# Patient Record
Sex: Female | Born: 1965 | Race: White | Hispanic: No | Marital: Married | Smoking: Never smoker
Health system: Southern US, Community
[De-identification: ages and names within clinical notes are randomized; demographics above are authoritative.]

## PROBLEM LIST (undated history)

## (undated) DIAGNOSIS — T7840XA Allergy, unspecified, initial encounter: Secondary | ICD-10-CM

## (undated) DIAGNOSIS — L409 Psoriasis, unspecified: Secondary | ICD-10-CM

## (undated) HISTORY — PX: TONSILLECTOMY: SHX5217

## (undated) HISTORY — PX: WISDOM TOOTH EXTRACTION: SHX21

## (undated) HISTORY — PX: SINUSOTOMY: SHX291

## (undated) HISTORY — DX: Allergy, unspecified, initial encounter: T78.40XA

## (undated) HISTORY — PX: SHOULDER SURGERY: SHX246

## (undated) HISTORY — DX: Psoriasis, unspecified: L40.9

---

## 1998-08-26 ENCOUNTER — Ambulatory Visit (HOSPITAL_COMMUNITY): Admission: RE | Admit: 1998-08-26 | Discharge: 1998-08-26 | Payer: Self-pay | Admitting: Dermatology

## 1998-08-26 ENCOUNTER — Encounter: Payer: Self-pay | Admitting: Dermatology

## 1999-01-10 ENCOUNTER — Other Ambulatory Visit: Admission: RE | Admit: 1999-01-10 | Discharge: 1999-01-10 | Payer: Self-pay | Admitting: Gynecology

## 2000-03-13 ENCOUNTER — Other Ambulatory Visit: Admission: RE | Admit: 2000-03-13 | Discharge: 2000-03-13 | Payer: Self-pay | Admitting: Obstetrics & Gynecology

## 2001-03-13 ENCOUNTER — Other Ambulatory Visit: Admission: RE | Admit: 2001-03-13 | Discharge: 2001-03-13 | Payer: Self-pay | Admitting: Obstetrics & Gynecology

## 2002-04-10 ENCOUNTER — Other Ambulatory Visit: Admission: RE | Admit: 2002-04-10 | Discharge: 2002-04-10 | Payer: Self-pay | Admitting: Obstetrics & Gynecology

## 2002-06-12 ENCOUNTER — Encounter (INDEPENDENT_AMBULATORY_CARE_PROVIDER_SITE_OTHER): Payer: Self-pay

## 2002-06-12 ENCOUNTER — Ambulatory Visit (HOSPITAL_COMMUNITY): Admission: RE | Admit: 2002-06-12 | Discharge: 2002-06-12 | Payer: Self-pay | Admitting: Dermatology

## 2002-06-12 ENCOUNTER — Encounter: Payer: Self-pay | Admitting: Dermatology

## 2003-04-14 ENCOUNTER — Other Ambulatory Visit: Admission: RE | Admit: 2003-04-14 | Discharge: 2003-04-14 | Payer: Self-pay | Admitting: Obstetrics & Gynecology

## 2004-01-28 ENCOUNTER — Ambulatory Visit (HOSPITAL_COMMUNITY): Admission: RE | Admit: 2004-01-28 | Discharge: 2004-01-28 | Payer: Self-pay | Admitting: Emergency Medicine

## 2005-03-02 ENCOUNTER — Encounter: Admission: RE | Admit: 2005-03-02 | Discharge: 2005-05-31 | Payer: Self-pay | Admitting: Psychology

## 2005-03-02 ENCOUNTER — Ambulatory Visit: Payer: Self-pay | Admitting: Psychology

## 2005-03-08 ENCOUNTER — Encounter: Admission: RE | Admit: 2005-03-08 | Discharge: 2005-03-08 | Payer: Self-pay | Admitting: Emergency Medicine

## 2005-03-08 ENCOUNTER — Ambulatory Visit (HOSPITAL_COMMUNITY): Admission: AD | Admit: 2005-03-08 | Discharge: 2005-03-09 | Payer: Self-pay | Admitting: Emergency Medicine

## 2005-03-09 ENCOUNTER — Encounter (INDEPENDENT_AMBULATORY_CARE_PROVIDER_SITE_OTHER): Payer: Self-pay | Admitting: Specialist

## 2005-03-30 ENCOUNTER — Ambulatory Visit: Payer: Self-pay | Admitting: Psychology

## 2005-04-13 ENCOUNTER — Encounter: Admission: RE | Admit: 2005-04-13 | Discharge: 2005-04-13 | Payer: Self-pay | Admitting: Gastroenterology

## 2005-05-11 ENCOUNTER — Other Ambulatory Visit: Admission: RE | Admit: 2005-05-11 | Discharge: 2005-05-11 | Payer: Self-pay | Admitting: Obstetrics and Gynecology

## 2005-09-07 ENCOUNTER — Encounter (INDEPENDENT_AMBULATORY_CARE_PROVIDER_SITE_OTHER): Payer: Self-pay | Admitting: Specialist

## 2005-09-07 ENCOUNTER — Ambulatory Visit (HOSPITAL_COMMUNITY): Admission: RE | Admit: 2005-09-07 | Discharge: 2005-09-07 | Payer: Self-pay | Admitting: Dermatology

## 2005-09-14 ENCOUNTER — Ambulatory Visit: Payer: Self-pay | Admitting: Internal Medicine

## 2005-09-19 ENCOUNTER — Ambulatory Visit: Payer: Self-pay | Admitting: Internal Medicine

## 2006-05-31 ENCOUNTER — Other Ambulatory Visit: Admission: RE | Admit: 2006-05-31 | Discharge: 2006-05-31 | Payer: Self-pay | Admitting: Obstetrics and Gynecology

## 2007-12-11 DIAGNOSIS — K5090 Crohn's disease, unspecified, without complications: Secondary | ICD-10-CM | POA: Insufficient documentation

## 2007-12-11 DIAGNOSIS — J029 Acute pharyngitis, unspecified: Secondary | ICD-10-CM | POA: Insufficient documentation

## 2007-12-11 DIAGNOSIS — J309 Allergic rhinitis, unspecified: Secondary | ICD-10-CM | POA: Insufficient documentation

## 2007-12-12 ENCOUNTER — Ambulatory Visit: Payer: Self-pay | Admitting: Internal Medicine

## 2007-12-12 DIAGNOSIS — L272 Dermatitis due to ingested food: Secondary | ICD-10-CM | POA: Insufficient documentation

## 2007-12-12 LAB — CONVERTED CEMR LAB
Basophils Absolute: 0.1 10*3/uL (ref 0.0–0.1)
Eosinophils Absolute: 0.3 10*3/uL (ref 0.0–0.6)
Eosinophils Relative: 3.4 % (ref 0.0–5.0)
MCHC: 33.7 g/dL (ref 30.0–36.0)
Monocytes Relative: 6.7 % (ref 3.0–11.0)
Neutrophils Relative %: 72.1 % (ref 43.0–77.0)
Platelets: 201 10*3/uL (ref 150–400)
RDW: 12.8 % (ref 11.5–14.6)

## 2007-12-13 DIAGNOSIS — L408 Other psoriasis: Secondary | ICD-10-CM | POA: Insufficient documentation

## 2008-01-01 ENCOUNTER — Telehealth (INDEPENDENT_AMBULATORY_CARE_PROVIDER_SITE_OTHER): Payer: Self-pay | Admitting: *Deleted

## 2008-01-02 ENCOUNTER — Ambulatory Visit: Payer: Self-pay | Admitting: Internal Medicine

## 2008-01-14 ENCOUNTER — Ambulatory Visit: Payer: Self-pay | Admitting: Internal Medicine

## 2008-01-14 LAB — CONVERTED CEMR LAB
ALT: 18 units/L (ref 0–35)
Albumin: 3.8 g/dL (ref 3.5–5.2)
Alkaline Phosphatase: 49 units/L (ref 39–117)
Basophils Relative: 0.8 % (ref 0.0–1.0)
Hemoglobin: 13.6 g/dL (ref 12.0–15.0)
MCV: 97.1 fL (ref 78.0–100.0)
Monocytes Relative: 6.7 % (ref 3.0–12.0)
Neutro Abs: 4.5 10*3/uL (ref 1.4–7.7)
Neutrophils Relative %: 69.8 % (ref 43.0–77.0)
Platelets: 189 10*3/uL (ref 150–400)
RBC: 4.21 M/uL (ref 3.87–5.11)
RDW: 12.8 % (ref 11.5–14.6)
Total Bilirubin: 1 mg/dL (ref 0.3–1.2)
Total Protein: 7 g/dL (ref 6.0–8.3)
WBC: 6.4 10*3/uL (ref 4.5–10.5)

## 2008-02-02 ENCOUNTER — Telehealth (INDEPENDENT_AMBULATORY_CARE_PROVIDER_SITE_OTHER): Payer: Self-pay | Admitting: *Deleted

## 2008-03-26 ENCOUNTER — Telehealth (INDEPENDENT_AMBULATORY_CARE_PROVIDER_SITE_OTHER): Payer: Self-pay | Admitting: *Deleted

## 2008-10-21 ENCOUNTER — Encounter: Admission: RE | Admit: 2008-10-21 | Discharge: 2008-10-21 | Payer: Self-pay | Admitting: Obstetrics and Gynecology

## 2009-02-17 ENCOUNTER — Ambulatory Visit: Payer: Self-pay | Admitting: Family Medicine

## 2009-11-04 ENCOUNTER — Encounter: Admission: RE | Admit: 2009-11-04 | Discharge: 2009-11-04 | Payer: Self-pay | Admitting: Obstetrics and Gynecology

## 2010-10-23 ENCOUNTER — Encounter
Admission: RE | Admit: 2010-10-23 | Discharge: 2010-10-23 | Payer: Self-pay | Source: Home / Self Care | Attending: Obstetrics and Gynecology | Admitting: Obstetrics and Gynecology

## 2011-03-02 NOTE — Consult Note (Signed)
Rebekah Adams, Rebekah Adams                ACCOUNT NO.:  [id number]   MEDICAL RECORD NO.:  [id number]          PATIENT TYPE:  OIB   LOCATION:  5708                         FACILITY:  [facility]   PHYSICIAN:  Althea Grimmer. Santogade, M.D.DATE OF BIRTH:  31-Jan-1966   DATE OF CONSULTATION:  03/08/2005  DATE OF DISCHARGE:                                   CONSULTATION   Rebekah Adams is a 45 year old female whom I am asked to see for an abnormal CT  scan of her abdomen in association with right lower quadrant abdominal pain.  I first saw her in 2001 when she had diarrhea and lower abdominal cramping  after a trip to the [place] and exposure to antibiotics.  She did have blood  in her stool at that time but her sedimentation rate was 7 and hemoglobin  12.6 and symptoms seemed to completely resolve.  C. difficile toxin titer  was negative.  No further investigation was done at that time.  Apparently  last year, she had a similar episode and a CT scan showed thickening of her  terminal ileum.  Currently, a CT scan shows thickened terminal ileum,  thickened cecal wall, and involvement of the appendix with inflammation.  The patient has a white blood count of 15.3 and hemoglobin 12.6.  She has  psoriasis and has been on methotrexate.  In September 2003 liver biopsy  showed mild triaditis.  She has no family members with inflammatory bowel  disease.  When this current episode of discomfort began, she had epigastric  pain which moved to the right lower quadrant somewhat suggestive of  appendicitis, however, she never had high fevers, chills, or vomiting.   PAST MEDICAL HISTORY:  Pertinent for psoriasis and Rosacea, tonsillectomy,  and right shoulder arthroscopy.   CURRENT MEDICATIONS:  Methotrexate.   ALLERGIES:  None reported.   FAMILY HISTORY:  Pertinent for gallstones in her father, no history of  inflammatory bowel disease.   SOCIAL HISTORY:  Married, physical therapist, occasional alcohol, nonsmoker.   REVIEW OF SYMPTOMS:  Negative except for above.   PHYSICAL EXAMINATION:  GENERAL:  Well developed, well nourished adult female in no acute distress.  VITAL SIGNS:  Current temperature 99, pulse 87, blood pressure 99/67.  SKIN:  A few scattered patches of psoriasis.  HEENT:  Eyes anicteric.  Conjunctivae pink.  Oropharynx unremarkable without  aphthous ulcers.  NECK:  Supple without thyromegaly, there is no cervical or inguinal  adenopathy.  CHEST:  Clear.  HEART:  Regular rate and rhythm.  ABDOMEN:  Soft with bowel sounds present.  There is mild tenderness in the  right lower quadrant but no rebound, I do not appreciate a mass.  RECTAL:  Exam not performed.  EXTREMITIES:  Without cyanosis, clubbing, and edema.   IMPRESSION:  45 year old female with right lower quadrant abdominal  discomfort and an abnormal CT which suggests a chronic inflammatory process.  Since this was present a year ago and she has had recurrent abdominal  problems including an anal fissure in 2001, I suspect this is, indeed, a  case of Crohn's disease.  Other  differential diagnosis would include a  chronic appendicitis and unrelated intermittent ileal infections but this is  doubtful.  I believe she needs a colonoscopy.   PLAN:  Colonoscopy is reviewed with the patient and her family in terms of  technique, preparations, and risks of complications including bleeding and  perforation.  She agrees to proceed.  It will be scheduled at 8 a.m.  tomorrow morning.  Please see the orders.  Treatment with Ciprofloxacin and  Flagyl is a good idea and I concur until the diagnosis is made.       PJS/MEDQ  D:  03/08/2005  T:  03/08/2005  Job:  [id number]   cc:   Velora Heckler, MD  [address]  [place]  [place] [address]   Veverly Fells. Altheimer, M.D.  [address], [address]  [place]  [place] [address]  Fax: [phone]   Reuben Likes, M.D.  [address]  [place]  [place] [address]  Fax: [phone]

## 2011-03-02 NOTE — H&P (Signed)
Rebekah Adams, SHANDS                ACCOUNT NO.:  [id number]   MEDICAL RECORD NO.:  [id number]          PATIENT TYPE:  OIB   LOCATION:  2853                         FACILITY:  [facility]   PHYSICIAN:  Cherylynn Ridges, M.D.    DATE OF BIRTH:  16-Nov-1965   DATE OF ADMISSION:  03/08/2005  DATE OF DISCHARGE:                                HISTORY & PHYSICAL   IDENTIFICATION/CHIEF COMPLAINT:  The patient is an otherwise healthy 45-year-  old with acute appendicitis and right lower quadrant inflammatory process as  noted on CT scan.   HISTORY OF PRESENT ILLNESS:  Per the patient's history she has had some  abdominal discomfort for about ten days. Initial evaluation did not disclose  any significant problem, although a white count was mildly elevated  previously. It was elevated again today at a level of 12,300. It was felt as  though she possibly had some inflammatory process going on in her right  lower quadrant, so Dr. Lorenz Coaster took the patient for CT scan of the abdomen  and pelvis. This demonstrated that she had acute appendicitis very likely  and also a right lower quadrant inflammatory process, possibly in  inflammatory bowel disease process, but certainly a thickened wall appendix  associated with this problem. She was to be evaluated at the [facility]  emergency room, however because of conflicts the patient came to urgent  office where she was evaluated and then subsequently referred to [facility] for evaluation by Dr. Gerrit Friends.   PAST MEDICAL HISTORY:  Significant only really for psoriasis for which she  takes methotrexate. She takes it three times a week at 2.5 mg and she also  takes Allegra and Yasmin. No known drug allergies. She is not allergic to  Latex. Previous surgery is for right shoulder arthroscopy, tonsillectomy,  and a rhinoplasty and septoplasty. No previous hospitalization.   She is married without children. She is a nonsmoker. She does occasionally  have alcohol. No  significant past family history.   REVIEW OF SYSTEMS:  Please see sign and dated sheet on the patient's chart.   PHYSICAL EXAMINATION:  GENERAL: Of note, in spite of this process she has  felt fairly well and came to [place] mainly just because of persistent  discomfort.  VITAL SIGNS: She has a low-grade fever in the office of 99.8. Her pulse is  105, blood pressure 111/68. She is 5 feet 5.5 inches, 144 pounds. She is  normocephalic and atraumatic. She does not appear to be in moderate  distress.  NECK: Supple.  CHEST: Clear to auscultation.  CARDIAC: Regular rate and rhythm with no murmurs, rubs, gallops, or heaves.  ABDOMEN: Tender in the right lower quadrant. She has psoriatic lesions of  the right abdominal wall in the periumbilical area and in the right lower  quadrant, mostly on the left side in the periumbilical area.  She has no  rebound, but she has guarding in the right lower quadrant. She has good  bowel sounds.   Her white count is 12,300, hemoglobin within normal limits. CT scan shows  appendicitis. White  count 12.6, hemoglobin 13.2. She has a left shift.   IMPRESSION:  Right lower quadrant inflammatory process. Certainly seems to  be one that could be acute appendicitis or chronic relapsing appendicitis,  but it is associated with her inflammatory process, and does involve  her cecum and terminal ileum. We talked with Dr. Gerrit Friends about [name] and  we will go ahead and send her over to Short Stay where she will be evaluated  for surgical intervention. The patient has eaten recently and will go and  meet her husband over there for surgical process.       JOW/MEDQ  D:  03/08/2005  T:  03/08/2005  Job:  [id number]   cc:   Reuben Likes, M.D.  [address]  [place]  [place] [address]  Fax: [phone]

## 2011-03-02 NOTE — Discharge Summary (Signed)
NAMETYLEAH, LOH                ACCOUNT NO.:  [id number]   MEDICAL RECORD NO.:  [id number]          PATIENT TYPE:  OIB   LOCATION:  5708                         FACILITY:  [facility]   PHYSICIAN:  Adolph Pollack, M.D.DATE OF BIRTH:  October 05, 1966   DATE OF ADMISSION:  03/08/2005  DATE OF DISCHARGE:  03/09/2005                                 DISCHARGE SUMMARY   HOSPITAL COURSE:  Ms. Delamar is a 45 year old female who was seen in the  office on Mar 08, 2005 with a right lower quadrant inflammatory process as  noted on CT scan.  Her history includes a 10-day abdominal discomfort.  Her  white count was elevated.  Therefore, Dr. Lorenz Coaster sent the patient for a CT  scan, and thus the diagnosis of the right lower quadrant inflammatory  process, which was felt to be acute appendicitis.  Also, it was felt that  the inflammatory process, even though it could represent acute appendicitis,  that it did involve her cecum and terminal ileum.   Because of the inflammatory process in areas more than her appendix, Dr.  Luther Parody was consulted.  He felt that the CT represented a chronic  inflammatory process and that she would need to undergo a colonoscopy.  He  thought indeed she had Crohn's disease.   The patient underwent a colonoscopy on Mar 09, 2005 and indeed was diagnosed  with Crohn's disease as well as enteritis of the small intestine.  At this  point, she was placed on Cipro, Flagyl, Entocort, and Pentasa, and her  inflammatory was felt to be secondary to these processes.  After the  colonoscopy, the patient was feeling better, and we decided that she was  appropriate for discharge.  She is being discharged to home in stable  condition, on the above medications.  She is also to continue her  methotrexate which she is taking for her psoriasis.   DISCHARGE DIAGNOSES:  1.  Crohn's disease.  2.  Small intestine enteritis.  3.  Psoriasis.  4.  Rosacea.  5.  History of tonsillectomy.  6.   History of right shoulder arthroscopy.      LB/MEDQ  D:  03/09/2005  T:  03/09/2005  Job:  [id number]   cc:   Reuben Likes, M.D.  [address]  [place]  [place] [address]  Fax: [phone]   Althea Grimmer. Luther Parody, M.D.  [address], [address]  [place]  [place] [address]  Fax: [phone]   Cherylynn Ridges, M.D.  [phone]  [place]  [place] [address]   Veverly Fells. Altheimer, M.D.  [address]  [place]  [place] [address]  Fax: [phone]

## 2011-06-19 ENCOUNTER — Emergency Department (HOSPITAL_COMMUNITY)
Admission: EM | Admit: 2011-06-19 | Discharge: 2011-06-19 | Disposition: A | Discharge status: Home / Self Care | Payer: PRIVATE HEALTH INSURANCE | Attending: Emergency Medicine | Admitting: Emergency Medicine

## 2011-06-19 ENCOUNTER — Emergency Department (HOSPITAL_COMMUNITY): Payer: PRIVATE HEALTH INSURANCE

## 2011-06-19 DIAGNOSIS — R05 Cough: Secondary | ICD-10-CM | POA: Insufficient documentation

## 2011-06-19 DIAGNOSIS — R079 Chest pain, unspecified: Secondary | ICD-10-CM | POA: Insufficient documentation

## 2011-06-19 DIAGNOSIS — K219 Gastro-esophageal reflux disease without esophagitis: Secondary | ICD-10-CM | POA: Insufficient documentation

## 2011-06-19 DIAGNOSIS — R059 Cough, unspecified: Secondary | ICD-10-CM | POA: Insufficient documentation

## 2011-06-19 LAB — COMPREHENSIVE METABOLIC PANEL
ALT: 11 U/L (ref 0–35)
Albumin: 3.9 g/dL (ref 3.5–5.2)
Alkaline Phosphatase: 59 U/L (ref 39–117)
Creatinine, Ser: 0.78 mg/dL (ref 0.50–1.10)
Glucose, Bld: 90 mg/dL (ref 70–99)
Potassium: 3.5 mEq/L (ref 3.5–5.1)
Sodium: 137 mEq/L (ref 135–145)
Total Bilirubin: 0.5 mg/dL (ref 0.3–1.2)
Total Protein: 6.9 g/dL (ref 6.0–8.3)

## 2011-06-19 LAB — POCT I-STAT TROPONIN I: Troponin i, poc: 0 ng/mL (ref 0.00–0.08)

## 2011-09-17 ENCOUNTER — Other Ambulatory Visit: Payer: Self-pay | Admitting: Obstetrics and Gynecology

## 2011-09-17 DIAGNOSIS — Z1231 Encounter for screening mammogram for malignant neoplasm of breast: Secondary | ICD-10-CM

## 2011-11-05 ENCOUNTER — Ambulatory Visit
Admission: RE | Admit: 2011-11-05 | Discharge: 2011-11-05 | Disposition: A | Discharge status: Home / Self Care | Payer: 59 | Source: Ambulatory Visit | Attending: Obstetrics and Gynecology | Admitting: Obstetrics and Gynecology

## 2011-11-05 DIAGNOSIS — Z1231 Encounter for screening mammogram for malignant neoplasm of breast: Secondary | ICD-10-CM

## 2012-07-04 ENCOUNTER — Encounter: Payer: Self-pay | Admitting: Obstetrics and Gynecology

## 2012-07-04 ENCOUNTER — Ambulatory Visit (INDEPENDENT_AMBULATORY_CARE_PROVIDER_SITE_OTHER): Payer: 59 | Admitting: Obstetrics and Gynecology

## 2012-07-04 VITALS — BP 104/62 | Ht 66.6 in | Wt 136.0 lb

## 2012-07-04 DIAGNOSIS — Z0000 Encounter for general adult medical examination without abnormal findings: Secondary | ICD-10-CM

## 2012-07-04 DIAGNOSIS — Z79899 Other long term (current) drug therapy: Secondary | ICD-10-CM

## 2012-07-04 DIAGNOSIS — Z124 Encounter for screening for malignant neoplasm of cervix: Secondary | ICD-10-CM

## 2012-07-04 NOTE — Progress Notes (Signed)
The patient reports:normal menses, no abnormal bleeding, pelvic pain or discharge  Contraception:no method  Last mammogram: was normal January 2013 Last pap: was normal October  2012  GC/Chlamydia cultures offered: declined HIV/RPR/HbsAg offered:  declined HSV 1 and 2 glycoprotein offered: declined  Menstrual cycle regular and monthly: Yes Menstrual flow normal: Yes  Urinary symptoms: urinary incontinence Normal bowel movements: Yes Reports abuse at home: No:  Subjective:    Rebekah Adams is a 46 y.o. female, No obstetric history on file., who presents for an annual exam.     History   Social History  . Marital Status: Married    Spouse Name: N/A    Number of Children: N/A  . Years of Education: N/A   Social History Main Topics  . Smoking status: Never Smoker   . Smokeless tobacco: Never Used  . Alcohol Use: No  . Drug Use: No  . Sexually Active: Yes    Birth Control/ Protection: None   Other Topics Concern  . None   Social History Narrative  . None    Menstrual cycle:   LMP: Patient's last menstrual period was 06/19/2012.           Cycle: normal  The following portions of the patient's history were reviewed and updated as appropriate: allergies, current medications, past family history, past medical history, past social history, past surgical history and problem list.  Review of Systems Pertinent items are noted in HPI. Breast:Negative for breast lump,nipple discharge or nipple retraction Gastrointestinal: Negative for abdominal pain, change in bowel habits or rectal bleeding Urinary:negative   Objective:    BP 104/62  Ht 5' 6.6" (1.692 m)  Wt 136 lb (61.689 kg)  BMI 21.56 kg/m2  LMP 06/19/2012    Weight:  Wt Readings from Last 1 Encounters:  07/04/12 136 lb (61.689 kg)          BMI: Body mass index is 21.56 kg/(m^2).  General Appearance: Alert, appropriate appearance for age. No acute distress HEENT: Grossly normal Neck / Thyroid: Supple, no  masses, nodes or enlargement Lungs: clear to auscultation bilaterally Back: No CVA tenderness Breast Exam: No masses or nodes.No dimpling, nipple retraction or discharge. Cardiovascular: Regular rate and rhythm. S1, S2, no murmur Gastrointestinal: Soft, non-tender, no masses or organomegaly Pelvic Exam: Vulva and vagina appear normal. Bimanual exam reveals normal uterus and adnexa. Rectovaginal: normal rectal, no masses Lymphatic Exam: Non-palpable nodes in neck, clavicular, axillary, or inguinal regions Skin: no rash or abnormalities Neurologic: Normal gait and speech, no tremor  Psychiatric: Alert and oriented, appropriate affect.      Assessment:    Normal gyn exam    Plan:    mammogram pap smear return annually or prn STD screening: declined Contraception:no method      Freida Busman JMD

## 2012-07-08 LAB — PAP IG W/ RFLX HPV ASCU

## 2012-07-22 ENCOUNTER — Other Ambulatory Visit (INDEPENDENT_AMBULATORY_CARE_PROVIDER_SITE_OTHER): Payer: 59

## 2012-07-22 ENCOUNTER — Other Ambulatory Visit: Payer: Self-pay

## 2012-07-22 DIAGNOSIS — Z13820 Encounter for screening for osteoporosis: Secondary | ICD-10-CM

## 2012-07-22 DIAGNOSIS — Z79899 Other long term (current) drug therapy: Secondary | ICD-10-CM

## 2015-05-16 ENCOUNTER — Ambulatory Visit
Admission: RE | Admit: 2015-05-16 | Discharge: 2015-05-16 | Disposition: A | Discharge status: Home / Self Care | Payer: 59 | Source: Ambulatory Visit | Attending: Sports Medicine | Admitting: Sports Medicine

## 2015-05-16 ENCOUNTER — Encounter: Payer: Self-pay | Admitting: Sports Medicine

## 2015-05-16 ENCOUNTER — Ambulatory Visit (INDEPENDENT_AMBULATORY_CARE_PROVIDER_SITE_OTHER): Payer: 59 | Admitting: Sports Medicine

## 2015-05-16 VITALS — BP 118/73 | Ht 66.5 in | Wt 138.0 lb

## 2015-05-16 DIAGNOSIS — M25512 Pain in left shoulder: Secondary | ICD-10-CM

## 2015-05-16 NOTE — Progress Notes (Signed)
  Rebekah Adams - 49 y.o. female MRN [id number]  Date of birth: [birth year]   Rebekah Adams is a 49 y.o. female who presents today for right shoulder pain.   Pain started after an injury in January 2016. She was walking her 75 lb pound and her left shoulder was pulled horizontally and adducted.  It was uncomfortable when it happened. She denies any prior injury to her left shoulder. 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  The pain became more severe in May and was placed on mobic and flexeril. She started seeing Ellamae Sia in May as well. She was seeing him weekly for PT treatments.  Most recently it seems that she has plateaued in these treatments.  She didn't receive a treatment in the last week and noticed some pain at rest on Saturday and it wrapped around anteriorly. She does endorse some clunking when she is lying in bed. She tries to not lie on that side when sleeping.    PMHx - reviewed.  ROS Per HPI   Exam:  Filed Vitals:   05/16/15 1028  BP: 118/73   Gen: NAD Cardiorespiratory - Normal respiratory effort/rate.   MSK: right hand dominant  Neurovascularly intact  Shoulder: Laterality: left Appearance: symmetric, no obvious deformities  Tenderness: none Range of Motion: normal flexion and abduction.  Some ache upon full flexion.  Some limited ROM on passive external rotation (60 degrees) when compared to right (90 degrees) Maneuvers: Empty can: neg  Resisted Internal rotation: normal   Resisted External rotation: normal  O'Brien's test: neg  Sheer test: positive on left  Strength:  Bicep: 5/5 Grip: 5/5  Imaging:  None

## 2015-05-16 NOTE — Assessment & Plan Note (Addendum)
Concern for injury to labrum based on injury hx and plateau of her PT treatments  Shear test pos on left and some limited external passive ROM which she may have component of adhesive capsulitis as well.  - x-ray of left shoulder  - MRI/MRA of left shoulder  - continue PT  - based on imaging, treatment will be tailored. Phone follow-up after reviewing MRI arthrogram to delineate further treatment.

## 2015-06-01 ENCOUNTER — Other Ambulatory Visit: Payer: 59

## 2015-12-06 ENCOUNTER — Encounter: Payer: Self-pay | Admitting: Internal Medicine

## 2015-12-06 ENCOUNTER — Ambulatory Visit (INDEPENDENT_AMBULATORY_CARE_PROVIDER_SITE_OTHER): Payer: 59 | Admitting: Internal Medicine

## 2015-12-06 ENCOUNTER — Ambulatory Visit (HOSPITAL_COMMUNITY)
Admission: RE | Admit: 2015-12-06 | Discharge: 2015-12-06 | Disposition: A | Discharge status: Home / Self Care | Payer: 59 | Source: Ambulatory Visit | Attending: Internal Medicine | Admitting: Internal Medicine

## 2015-12-06 VITALS — BP 108/64 | HR 82 | Temp 98.0°F | Resp 18 | Ht 66.0 in | Wt 132.0 lb

## 2015-12-06 DIAGNOSIS — M546 Pain in thoracic spine: Secondary | ICD-10-CM | POA: Insufficient documentation

## 2015-12-06 DIAGNOSIS — R109 Unspecified abdominal pain: Secondary | ICD-10-CM | POA: Insufficient documentation

## 2015-12-06 MED ORDER — CYCLOBENZAPRINE HCL 10 MG PO TABS: 10.0000 mg | ORAL_TABLET | Freq: Three times a day (TID) | ORAL | Status: AC | PRN

## 2015-12-06 NOTE — Progress Notes (Signed)
Subjective:    Patient ID: Rebekah Adams, female    DOB: January 02, 1966, 50 y.o.   MRN: [id number]  Abdominal Pain Associated symptoms include constipation and nausea. Pertinent negatives include no arthralgias, diarrhea, dysuria, fever, frequency, headaches, hematuria or vomiting.  Flank Pain Associated symptoms include abdominal pain and numbness. Pertinent negatives include no dysuria, fever, headaches or weakness.   Patient presents to the office for evaluation of bilateral flank pain which has been going on for roughly 1 week.  She reports that the pain is alternating on both sides.  She reports that she though that it was originally musculoskeletal pain but mobic doesn't seem to help. Pain is deep and intense, but diffuse.  She feels like she is exquisitely tender over her lower rib cage.  She has had no urinary frequency, urgency, hematuria, or dysuria.  She has no history of frequent UTI.  She has not had kidney stones in the past.  Bowels have been slightly more constipated than usual. She does feel bloated and uncomfortable.      She has chronically had had some mid back pain which has been getting worse since her recent travels to [place]. [place].  She does feel that back pain is accompanied with numbness on the left side.  She does tend to have some left sided shoulder discomfort as well from prior shoulder injury.  She did do physical therapy for the shoulder.    To this point she has had no injury and no evaluation for her back pain.   Review of Systems  Constitutional: Negative for fever, chills and fatigue.  Respiratory: Positive for shortness of breath. Negative for cough, chest tightness and wheezing.   Cardiovascular: Negative for leg swelling.  Gastrointestinal: Positive for nausea, abdominal pain and constipation. Negative for vomiting, diarrhea, blood in stool, abdominal distention and anal bleeding.       No water brash or reflux.  Genitourinary: Positive for flank pain. Negative  for dysuria, urgency, frequency, hematuria and difficulty urinating.  Musculoskeletal: Positive for back pain. Negative for joint swelling, arthralgias, gait problem and neck stiffness.  Neurological: Positive for numbness. Negative for dizziness, speech difficulty, weakness and headaches.       Objective:   Physical Exam  Constitutional: She is oriented to person, place, and time. She appears well-developed and well-nourished. No distress.  HENT:  Head: Normocephalic.  Mouth/Throat: Oropharynx is clear and moist. No oropharyngeal exudate.  Eyes: Conjunctivae and EOM are normal. Pupils are equal, round, and reactive to light. No scleral icterus.  Neck: Normal range of motion. Neck supple. No JVD present. No thyromegaly present.  Cardiovascular: Normal rate, regular rhythm, normal heart sounds and intact distal pulses.  Exam reveals no gallop and no friction rub.   No murmur heard. Pulmonary/Chest: Effort normal and breath sounds normal. No respiratory distress. She has no wheezes. She has no rales. She exhibits no tenderness.  Abdominal: Soft. Bowel sounds are normal. She exhibits no distension and no mass. There is no tenderness. There is no rebound and no guarding.  Musculoskeletal:  Patient rises slowly from sitting to standing.  They walk without an antalgic gait.  There is no evidence of erythema, ecchymosis, or gross deformity.  There is tenderness to palpation over right thoracic paraspinal muscles and right flank without CVA tenderness.  Active ROM is full but mildly painful with left lateral bending and also twisting activities.  Sensation to light touch is intact over all extremities.  Strength is symmetric and equal  in all extremities.         Lymphadenopathy:    She has no cervical adenopathy.  Neurological: She is alert and oriented to person, place, and time. She has normal strength. No cranial nerve deficit or sensory deficit. Coordination and gait normal.  Skin: Skin is warm  and dry. She is not diaphoretic.  Psychiatric: She has a normal mood and affect. Her behavior is normal. Judgment and thought content normal.  Nursing note and vitals reviewed.   Filed Vitals:   12/06/15 1414  BP: 108/64  Pulse: 82  Temp: 98 F (36.7 C)  Resp: 18         Assessment & Plan:    1. Flank pain Flank pain is reproducible with palpation and motion.  History and PE suggests that this is likely a musculoskeletal injury in nature ie muscle strain vs. Muscle spasm -flexeril -offered prednisone which patient adamantly rejected.   -No need for urgent MRI at this time -doubt PE given negative wells criteria -if no improvement with mobic and flexeril can consider MRI if no improvement.  - Urinalysis, Routine w reflex microscopic (not at [facility]) - Culture, Urine - DG Lumbar Spine Complete; Future - DG Thoracic Spine W/Swimmers; Future

## 2015-12-07 LAB — URINALYSIS, ROUTINE W REFLEX MICROSCOPIC
Bilirubin Urine: NEGATIVE
Glucose, UA: NEGATIVE
HGB URINE DIPSTICK: NEGATIVE
Leukocytes, UA: NEGATIVE
NITRITE: NEGATIVE
Protein, ur: NEGATIVE
SPECIFIC GRAVITY, URINE: 1.024 (ref 1.001–1.035)
pH: 7 (ref 5.0–8.0)

## 2015-12-07 LAB — URINE CULTURE
COLONY COUNT: NO GROWTH
ORGANISM ID, BACTERIA: NO GROWTH

## 2017-01-08 ENCOUNTER — Ambulatory Visit (INDEPENDENT_AMBULATORY_CARE_PROVIDER_SITE_OTHER): Payer: 59 | Admitting: Internal Medicine

## 2017-01-08 ENCOUNTER — Encounter: Payer: Self-pay | Admitting: Internal Medicine

## 2017-01-08 VITALS — BP 116/62 | HR 74 | Temp 98.6°F | Resp 16 | Ht 66.0 in

## 2017-01-08 DIAGNOSIS — A09 Infectious gastroenteritis and colitis, unspecified: Secondary | ICD-10-CM | POA: Diagnosis not present

## 2017-01-08 DIAGNOSIS — E039 Hypothyroidism, unspecified: Secondary | ICD-10-CM | POA: Diagnosis not present

## 2017-01-08 DIAGNOSIS — R197 Diarrhea, unspecified: Secondary | ICD-10-CM

## 2017-01-08 MED ORDER — HYOSCYAMINE SULFATE SL 0.125 MG SL SUBL: 1.0000 | SUBLINGUAL_TABLET | Freq: Four times a day (QID) | SUBLINGUAL | 0 refills | Status: DC

## 2017-01-08 NOTE — Progress Notes (Signed)
Assessment and Plan:   1. Diarrhea of presumed infectious origin  - H. pylori breath test - Gastrointestinal Pathogen Panel PCR - Fecal fat, qualtitative  2. Hypothyroidism, unspecified type -on armour thyroid and is managed by Dr. Allena Napoleon - TSH    HPI 51 y.o.female presents for evaluation of stomach issues which has been going on for the last 1.5 weeks.  She reports that she developed some nausea, headache, tinnitus, and some watery diarrhea.  She reports that eating did seem to help with the symptoms but then it would come back. She reports that she did have some constipation while she was on vacation last week. She reports that she did have an infection with H. Pylori quite some time ago.  She did have 2-3 episodes of looser stools.  She reports that she feels like she is not completely emptying her bowels.  She reports that it is somewhat formed but not her typical.  She did not take any medications.  She is not sure that her diet has changed.  She reports that she has not had any poor tasting foods.  She feels like there is a pressure and some strangeness swallowing.  She has not had hoarseness or a dry cough.  She does have some more hiccuping.  She went to [place].    Past Medical History:  Diagnosis Date  . Allergy   . Psoriasis      Allergies  Allergen Reactions  . Flagyl [Metronidazole]   . Olive Leaf Extract     REACTION: lip swells      Current Outpatient Prescriptions on File Prior to Visit  Medication Sig Dispense Refill  . progesterone (PROMETRIUM) 100 MG capsule Take 100 mg by mouth daily.     Marland Kitchen Ustekinumab (STELARA [place]) Inject into the skin.     No current facility-administered medications on file prior to visit.     Review of Systems  Constitutional: Negative for chills, fever and malaise/fatigue.  HENT: Positive for congestion, sinus pain and tinnitus. Negative for sore throat.   Respiratory: Negative for cough, sputum production, shortness of  breath and wheezing.   Cardiovascular: Negative for chest pain, palpitations and leg swelling.  Gastrointestinal: Positive for diarrhea and nausea. Negative for abdominal pain, blood in stool, constipation, heartburn, melena and vomiting.  Genitourinary: Negative.   Neurological: Positive for dizziness. Negative for tremors and loss of consciousness.  Psychiatric/Behavioral: Negative for depression. The patient is not nervous/anxious and does not have insomnia.      Physical Exam: There were no vitals filed for this visit. BP 116/62   Pulse 74   Temp 98.6 F (37 C) (Temporal)   Resp 16   Ht 5\' 6"  (1.676 m)  General Appearance: Well developed well nourished, non-toxic appearing in no apparent distress. Eyes: PERRLA, EOMs, conjunctiva w/ no swelling or erythema or discharge Sinuses: No Frontal/maxillary tenderness ENT/Mouth: Ear canals clear without swelling or erythema.  TM's normal bilaterally with no retractions, bulging, or loss of landmarks.   Neck: Supple, thyroid normal, no notable JVD  Respiratory: Respiratory effort normal, Clear breath sounds anteriorly and posteriorly bilaterally without rales, rhonchi, wheezing or stridor. No retractions or accessory muscle usage. Cardio: RRR with no MRGs.   Abdomen: Soft, + BS.  Non tender, no guarding, rebound, hernias, masses.  Musculoskeletal: Full ROM, 5/5 strength, normal gait.  Skin: Warm, dry without rashes  Neuro: Awake and oriented X 3, Cranial nerves intact. Normal muscle tone, no cerebellar symptoms. Sensation intact.  Psych:  normal affect, Insight and Judgment appropriate.     Terri Piedraourtney Forcucci, PA-C 3:30 PM [facility]

## 2017-01-09 ENCOUNTER — Other Ambulatory Visit: Payer: Self-pay | Admitting: Internal Medicine

## 2017-01-09 LAB — TSH

## 2017-01-09 LAB — H. PYLORI BREATH TEST: H. pylori Breath Test: NOT DETECTED

## 2017-01-09 MED ORDER — THYROID 90 MG PO TABS: 90.0000 mg | ORAL_TABLET | Freq: Every day | ORAL | 0 refills | Status: AC

## 2017-01-10 LAB — GASTROINTESTINAL PATHOGEN PANEL PCR
C. DIFFICILE TOX A/B, PCR: NOT DETECTED
CAMPYLOBACTER, PCR: NOT DETECTED
CRYPTOSPORIDIUM, PCR: NOT DETECTED
E COLI 0157, PCR: NOT DETECTED
E coli (ETEC) LT/ST PCR: NOT DETECTED
E coli (STEC) stx1/stx2, PCR: NOT DETECTED
GIARDIA LAMBLIA, PCR: NOT DETECTED
Norovirus, PCR: NOT DETECTED
ROTAVIRUS, PCR: NOT DETECTED
SALMONELLA, PCR: NOT DETECTED
Shigella, PCR: NOT DETECTED

## 2017-01-15 LAB — GASTROINTESTINAL PATHOGEN PANEL PCR

## 2017-01-17 LAB — FECAL FAT, QUALITATIVE: FECAL FAT QUALITATIVE: ABNORMAL — AB

## 2017-08-26 ENCOUNTER — Ambulatory Visit (INDEPENDENT_AMBULATORY_CARE_PROVIDER_SITE_OTHER): Payer: 59

## 2017-08-26 ENCOUNTER — Other Ambulatory Visit: Payer: Self-pay

## 2017-08-26 ENCOUNTER — Encounter: Payer: Self-pay | Admitting: Family Medicine

## 2017-08-26 ENCOUNTER — Ambulatory Visit (INDEPENDENT_AMBULATORY_CARE_PROVIDER_SITE_OTHER): Payer: 59 | Admitting: Family Medicine

## 2017-08-26 VITALS — BP 96/62 | HR 90 | Temp 98.3°F | Resp 16 | Ht 66.0 in | Wt 138.0 lb

## 2017-08-26 DIAGNOSIS — S6991XA Unspecified injury of right wrist, hand and finger(s), initial encounter: Secondary | ICD-10-CM

## 2017-08-26 DIAGNOSIS — M79644 Pain in right finger(s): Secondary | ICD-10-CM | POA: Diagnosis not present

## 2017-08-26 NOTE — Patient Instructions (Signed)
     IF you received an x-ray today, you will receive an invoice from [facility]. Please contact [facility] at [phone] with questions or concerns regarding your invoice.   IF you received labwork today, you will receive an invoice from [facility]. Please contact [facility] at [phone] with questions or concerns regarding your invoice.   Our billing staff will not be able to assist you with questions regarding bills from these companies.  You will be contacted with the lab results as soon as they are available. The fastest way to get your results is to activate your My Chart account. Instructions are located on the last page of this paperwork. If you have not heard from us regarding the results in 2 weeks, please contact this office.    We recommend that you schedule a mammogram for breast cancer screening. Typically, you do not need a referral to do this. Please contact a local imaging center to schedule your mammogram.  [facility] - [phone]  *ask for the Radiology Department [facility]) - [phone] or [phone]  [facility] - [phone] [facility] - [phone] [facility] - [phone]  *ask for the Radiology Department [facility] - [phone]  *ask for the Radiology Department [facility] - [phone]  *ask for the Mammography Department [facility] - [phone] 

## 2017-08-26 NOTE — Progress Notes (Signed)
Chief Complaint  Patient presents with  . right hand pain    onset: 07/04/17 while [place] beach walking dog and leash wrapped around hand- caused some lacerations, ? sprain, swellling and stiffness.  Buddy taping to help and  per pt hard to grasp things tightly    HPI  Patient reports that she was walking her dog and the leash wrapped around her middle finger and bruised it  This was around 07/04/17 There is sprain, swelling and stiffness She buddy taped it but she still has difficulty with grasp     Past Medical History:  Diagnosis Date  . Allergy   . Psoriasis     Current Outpatient Medications  Medication Sig Dispense Refill  . progesterone (PROMETRIUM) 100 MG capsule Take 100 mg by mouth daily.     Marland Kitchen. Ustekinumab (STELARA [place]) Inject into the skin.    . Estradiol Acetate (FEMRING) 0.1 MG/24HR RING Place vaginally.    Marland Kitchen. Hyoscyamine Sulfate SL (LEVSIN/SL) 0.125 MG SUBL Place 1 tablet under the tongue every 6 (six) hours. (Patient not taking: Reported on 08/26/2017) 120 each 0  . thyroid (ARMOUR) 90 MG tablet Take 1 tablet (90 mg total) by mouth daily. (Patient not taking: Reported on 08/26/2017) 60 tablet 0  . thyroid (NP THYROID) 120 MG tablet Take 120 mg by mouth daily before breakfast.     No current facility-administered medications for this visit.     Allergies:  Allergies  Allergen Reactions  . Flagyl [Metronidazole]   . Olive Leaf Extract     REACTION: lip swells    Past Surgical History:  Procedure Laterality Date  . SHOULDER SURGERY    . SINUSOTOMY    . TONSILLECTOMY    . WISDOM TOOTH EXTRACTION      Social History   Socioeconomic History  . Marital status: Married    Spouse name: None  . Number of children: None  . Years of education: None  . Highest education level: None  Social Needs  . Financial resource strain: None  . Food insecurity - worry: None  . Food insecurity - inability: None  . Transportation needs - medical: None  . Transportation  needs - non-medical: None  Occupational History  . None  Tobacco Use  . Smoking status: Never Smoker  . Smokeless tobacco: Never Used  Substance and Sexual Activity  . Alcohol use: No  . Drug use: No  . Sexual activity: Yes    Birth control/protection: None  Other Topics Concern  . None  Social History Narrative  . None    Family History  Problem Relation Age of Onset  . Hyperlipidemia Mother   . Thyroid disease Mother   . Emphysema Mother   . COPD Mother   . Gout Father   . Thyroid disease Father      ROS Review of Systems See HPI Constitution: No fevers or chills No malaise No diaphoresis Skin: No rash or itching Eyes: no blurry vision, no double vision GU: no dysuria or hematuria Neuro: no dizziness or headaches    Objective: Vitals:   08/26/17 1509  BP: 96/62  Pulse: 90  Resp: 16  Temp: 98.3 F (36.8 C)  TempSrc: Oral  SpO2: 100%  Weight: 138 lb (62.6 kg)  Height: 5\' 6"  (1.676 m)    Physical Exam  Constitutional: She appears well-developed and well-nourished.  HENT:  Head: Normocephalic and atraumatic.  Cardiovascular: Normal rate, regular rhythm and normal heart sounds.  Pulmonary/Chest: Effort normal and breath  sounds normal. No stridor. No respiratory distress.   Right middle MCP joint is swollen and tender  XRAY- no fracture or dislocation   Assessment and Plan Bjorn LoserRhonda was seen today for right hand pain.  Diagnoses and all orders for this visit:  Pain of finger of right hand Injury of right middle finger, initial encounter -     DG Finger Middle Right  Advised pt to continue buddy taping and ibuprofen    Balthazar Dooly A Donte Lenzo

## 2019-11-07 IMAGING — DX DG FINGER MIDDLE 2+V*R*
3 series · 3 of 3 positions shown · non-contrast
Comparison: None.

CLINICAL DATA: Injury to the right middle finger. Initial
encounter.

EXAM:
RIGHT MIDDLE FINGER 2+V

[finger ap]
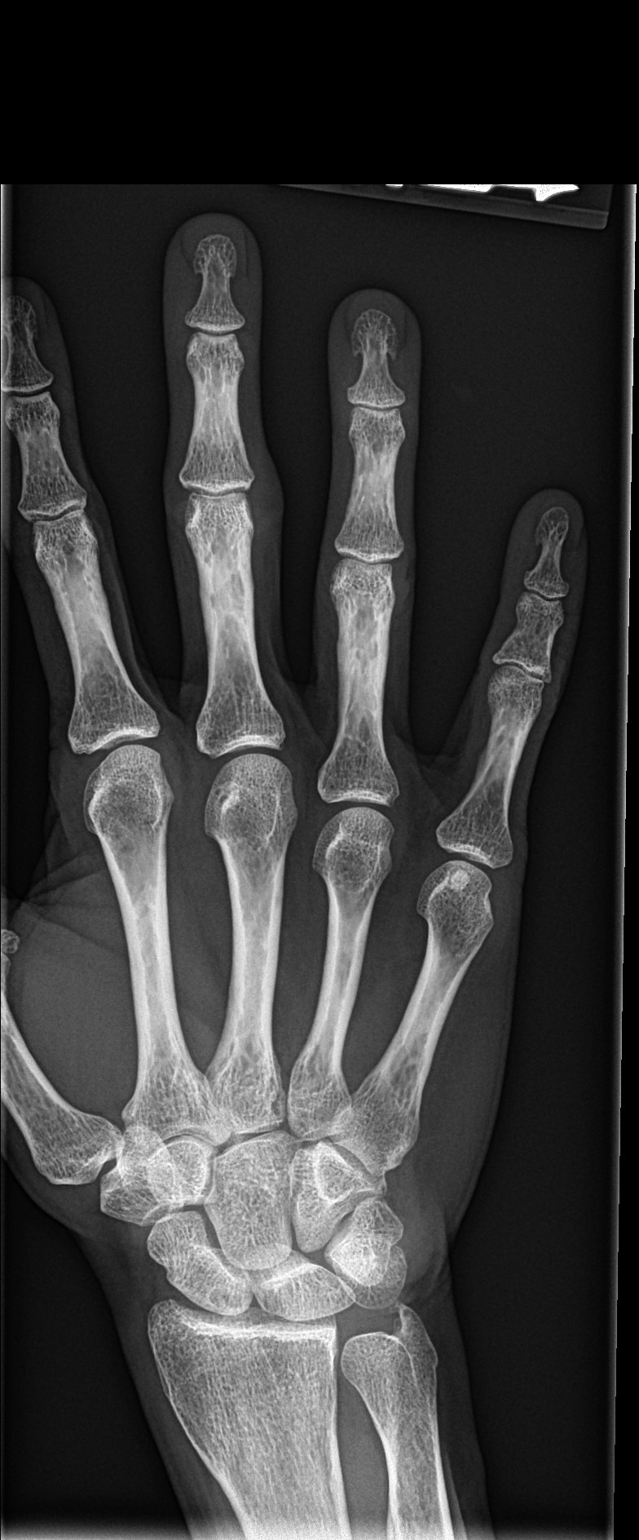

[finger obl]
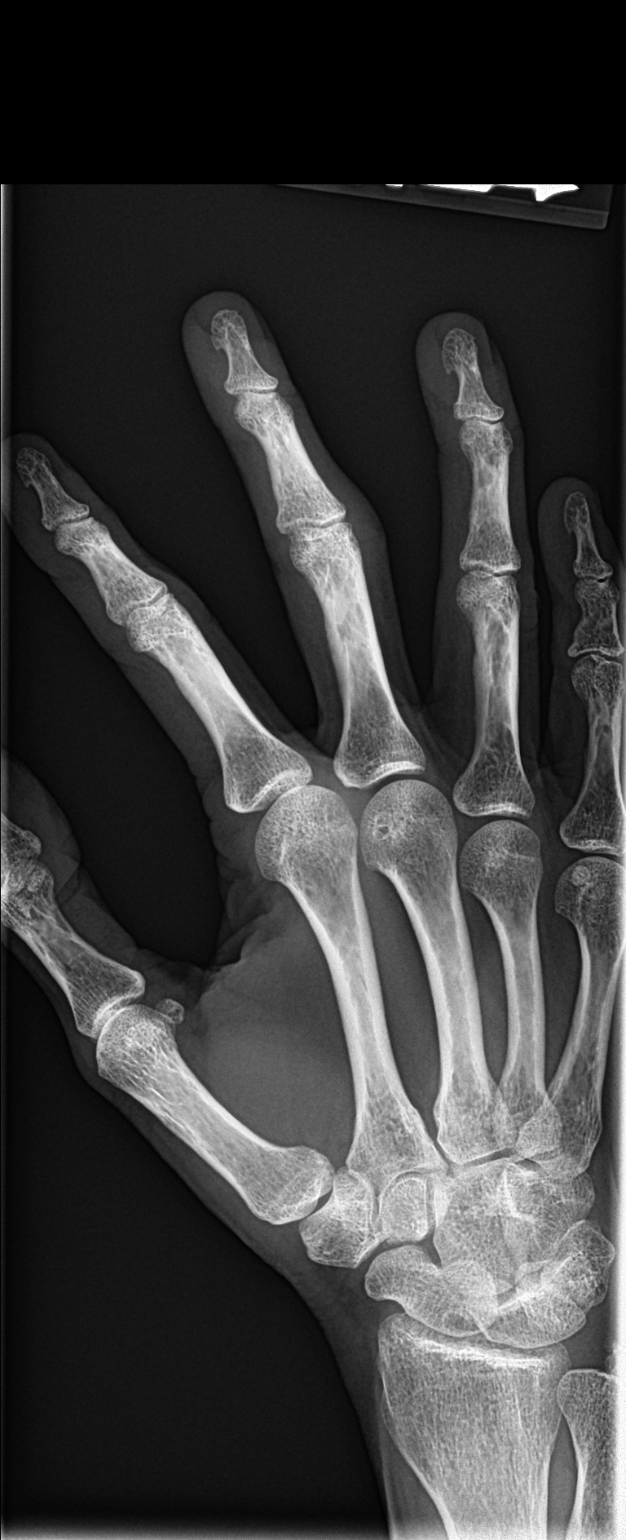

[finger lat]
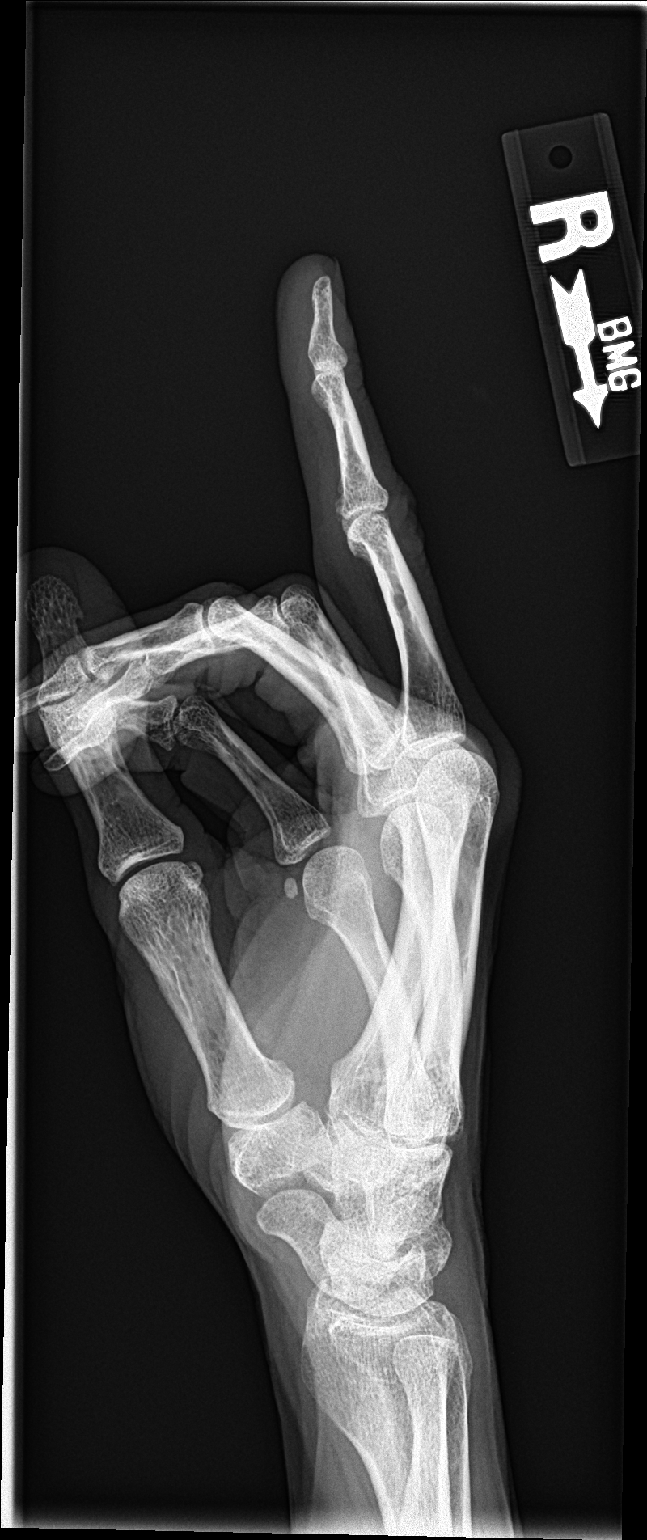

[3 of 3 positions shown; findings below may reference images not displayed]

FINDINGS: Mild soft tissue swelling at the PIP joint.

No fracture.  Joints are normally aligned.  No bone lesion.
IMPRESSION: 1. No fracture or dislocation.

## 2019-11-11 ENCOUNTER — Ambulatory Visit: Payer: 59 | Attending: Internal Medicine

## 2019-11-11 DIAGNOSIS — Z20822 Contact with and (suspected) exposure to covid-19: Secondary | ICD-10-CM

## 2019-11-12 LAB — NOVEL CORONAVIRUS, NAA: SARS-CoV-2, NAA: NOT DETECTED

## 2019-11-24 ENCOUNTER — Ambulatory Visit: Payer: 59 | Attending: Internal Medicine

## 2019-11-24 DIAGNOSIS — Z20822 Contact with and (suspected) exposure to covid-19: Secondary | ICD-10-CM

## 2019-11-25 LAB — NOVEL CORONAVIRUS, NAA: SARS-CoV-2, NAA: NOT DETECTED

## 2020-01-13 ENCOUNTER — Ambulatory Visit: Payer: 59 | Attending: Internal Medicine

## 2020-01-13 ENCOUNTER — Other Ambulatory Visit: Payer: 59

## 2020-01-13 DIAGNOSIS — Z20822 Contact with and (suspected) exposure to covid-19: Secondary | ICD-10-CM

## 2020-01-14 LAB — NOVEL CORONAVIRUS, NAA: SARS-CoV-2, NAA: NOT DETECTED

## 2020-05-04 ENCOUNTER — Other Ambulatory Visit: Payer: 59

## 2022-04-05 ENCOUNTER — Other Ambulatory Visit: Payer: Self-pay | Admitting: Family Medicine

## 2022-04-05 DIAGNOSIS — R202 Paresthesia of skin: Secondary | ICD-10-CM

## 2022-04-05 DIAGNOSIS — M79602 Pain in left arm: Secondary | ICD-10-CM

## 2022-04-20 ENCOUNTER — Other Ambulatory Visit: Payer: Self-pay

## 2022-05-10 ENCOUNTER — Ambulatory Visit (INDEPENDENT_AMBULATORY_CARE_PROVIDER_SITE_OTHER): Payer: No Typology Code available for payment source | Admitting: Neurology

## 2022-05-10 ENCOUNTER — Encounter: Payer: Self-pay | Admitting: Neurology

## 2022-05-10 VITALS — BP 105/75 | HR 94 | Ht 65.6 in | Wt 140.0 lb

## 2022-05-10 DIAGNOSIS — R202 Paresthesia of skin: Secondary | ICD-10-CM | POA: Diagnosis not present

## 2022-05-10 NOTE — Progress Notes (Signed)
Chief Complaint  Patient presents with   New Patient (Initial Visit)    Rm 15. Alone. NP/paper/[facility] Functional Med/Pain and paresthesia of L arm.      ASSESSMENT AND PLAN  RAMATOULAYE PACK is a 56 y.o. female   Left-sided paresthesia mainly involving left arm and left leg  Normal examination  Normal MRI of the brain  Complete evaluation with MRI of cervical spine with without contrast to rule out left hemicord structural abnormality      DIAGNOSTIC DATA (LABS, IMAGING, TESTING) - I reviewed patient records, labs, notes, testing and imaging myself where available.  Laboratory evaluation from April 11, 2022, LDL PE was elevated 1706, normal should be less than 1 solvent LDL-C was 152 normal should be less than 100, cholesterol total was 239, small LDL P was 595 normal should be less than 527, normal TSH, but free T4 was mildly decreased 0.48, normal CBC, hemoglobin 14.7, CMP, positive ANA 1-80, " m, rest of the inflammatory markers were negative, A1c 5.2, reverse T3 was low at 5.5, uric acid 5.0, ferritin 228, free T32.1,  Lyme titer IgG.  58 antibody was positive, so was IgM P39, IgM P23,  EB virus IgG was positive, parvovirus IgG was positive, thyroid antibody including thyroid peroxidase and thyroglobulin antibody was negative,  MEDICAL HISTORY:  Rebekah Adams is a 56 year old female, seen in request by her primary care doctor   Ferdinand Cava for evaluation of left-sided paresthesia  I reviewed and summarized the referring note. PMHX. Psoriasis has been on stable dose of Stelara subcutaneous injection  She suffered COVID infection in December 2022, then had a flareup of her psoriasis, following that, around February 2023, she began to noticed tenderness around joint, muscle achy pain, then in June 2023, she began to notice numbness mainly involving left side fourth and fifth fingers, occasionally first 3 fingers, extending to forearm,  Recently she also noticed  left lateral leg numbness, extending to the bottom of her left foot  Patient denies significant pain, no functional limitation, no bowel or bladder incontinence  I personally reviewed MRI of brain from orthopedic clinic in June 2023, mild nonspecific small vessel disease  She had extensive laboratory evaluation by her primary care physician in June, Lyme titer IgM came back positive on July 15, she is supposed to take doxycycline 100 mg twice a day for 30 days   PHYSICAL EXAM:   Vitals:   05/10/22 0827  BP: 105/75  Pulse: 94  Weight: 140 lb (63.5 kg)  Height: 5' 5.6" (1.666 m)   Not recorded     Body mass index is 22.87 kg/m.  PHYSICAL EXAMNIATION:  Gen: NAD, conversant, well nourised, well groomed                     Cardiovascular: Regular rate rhythm, no peripheral edema, warm, nontender. Eyes: Conjunctivae clear without exudates or hemorrhage Neck: Supple, no carotid bruits. Pulmonary: Clear to auscultation bilaterally   NEUROLOGICAL EXAM:  MENTAL STATUS: Speech/cognition: Awake, alert, oriented to history taking and casual conversation CRANIAL NERVES: CN II: Visual fields are full to confrontation. Pupils are round equal and briskly reactive to light. CN III, IV, VI: extraocular movement are normal. No ptosis. CN V: Facial sensation is intact to light touch CN VII: Face is symmetric with normal eye closure  CN VIII: Hearing is normal to causal conversation. CN IX, X: Phonation is normal. CN XI: Head turning and shoulder shrug are intact  MOTOR: There is no pronator drift of out-stretched arms. Muscle bulk and tone are normal. Muscle strength is normal.  REFLEXES: Reflexes are 2+ and symmetric at the biceps, triceps, knees, and ankles. Plantar responses are flexor.  SENSORY: Intact to light touch, pinprick and vibratory sensation are intact in fingers and toes.  COORDINATION: There is no trunk or limb dysmetria noted.  GAIT/STANCE: Posture is normal.  Gait is steady with normal steps, base, arm swing, and turning. Heel and toe walking are normal. Tandem gait is normal.  Romberg is absent.  REVIEW OF SYSTEMS:  Full 14 system review of systems performed and notable only for as above All other review of systems were negative.   ALLERGIES: Allergies  Allergen Reactions   Flagyl [Metronidazole]    Olive Leaf Extract     REACTION: lip swells   Other     All  Fluoroquinolones cause ligament and tendon pain.    HOME MEDICATIONS: Current Outpatient Medications  Medication Sig Dispense Refill   doxycycline (VIBRA-TABS) 100 MG tablet Take 100 mg by mouth 2 (two) times daily.     Estradiol Acetate (FEMRING) 0.1 MG/24HR RING Place vaginally.     Naltrexone HCl, Pain, 4.5 MG CAPS Take 1 capsule by mouth at bedtime.     progesterone (PROMETRIUM) 100 MG capsule Take 100 mg by mouth daily.      thyroid (ARMOUR) 90 MG tablet Take 1 tablet (90 mg total) by mouth daily. (Patient taking differently: Take 90 mg by mouth daily. Custom thyroid medication) 60 tablet 0   Ustekinumab (STELARA [place]) Inject into the skin.     No current facility-administered medications for this visit.    PAST MEDICAL HISTORY: Past Medical History:  Diagnosis Date   Allergy    Psoriasis     PAST SURGICAL HISTORY: Past Surgical History:  Procedure Laterality Date   SHOULDER SURGERY     SINUSOTOMY     TONSILLECTOMY     WISDOM TOOTH EXTRACTION      FAMILY HISTORY: Family History  Problem Relation Age of Onset   Hyperlipidemia Mother    Thyroid disease Mother    Emphysema Mother    COPD Mother    Gout Father    Thyroid disease Father     SOCIAL HISTORY: Social History   Socioeconomic History   Marital status: Married    Spouse name: Not on file   Number of children: Not on file   Years of education: Not on file   Highest education level: Not on file  Occupational History   Not on file  Tobacco Use   Smoking status: Never   Smokeless tobacco:  Never  Substance and Sexual Activity   Alcohol use: No   Drug use: No   Sexual activity: Yes    Birth control/protection: None  Other Topics Concern   Not on file  Social History Narrative   Not on file   Social Determinants of Health   Financial Resource Strain: Not on file  Food Insecurity: Not on file  Transportation Needs: Not on file  Physical Activity: Not on file  Stress: Not on file  Social Connections: Not on file  Intimate Partner Violence: Not on file      Levert Feinstein, M.D. Ph.D.  [facility] [address], [address] [place], [place] [address] Ph: [phone] Fax: [phone]  CC:  Ferdinand Cava, MD [facility] [place],  [place] [address]  Ferdinand Cava, MD

## 2022-05-14 ENCOUNTER — Telehealth: Payer: Self-pay | Admitting: Neurology

## 2022-05-14 NOTE — Telephone Encounter (Signed)
[facility] sent to GI they obtain auth  

## 2022-05-15 ENCOUNTER — Ambulatory Visit: Payer: No Typology Code available for payment source | Admitting: Neurology

## 2022-05-22 ENCOUNTER — Other Ambulatory Visit: Payer: No Typology Code available for payment source

## 2022-06-03 ENCOUNTER — Ambulatory Visit
Admission: RE | Admit: 2022-06-03 | Discharge: 2022-06-03 | Disposition: A | Discharge status: Home / Self Care | Payer: No Typology Code available for payment source | Source: Ambulatory Visit | Attending: Neurology | Admitting: Neurology

## 2022-06-03 DIAGNOSIS — R202 Paresthesia of skin: Secondary | ICD-10-CM

## 2022-06-03 MED ORDER — GADOBENATE DIMEGLUMINE 529 MG/ML IV SOLN
13.0000 mL | Freq: Once | INTRAVENOUS | Status: AC | PRN
  Administered 2022-06-03: 13 mL via INTRAVENOUS

## 2022-06-04 ENCOUNTER — Other Ambulatory Visit: Payer: No Typology Code available for payment source

## 2022-06-04 ENCOUNTER — Telehealth: Payer: Self-pay | Admitting: Neurology

## 2022-06-04 NOTE — Telephone Encounter (Signed)
  Please call patient, MRI of cervical spine showed mild degenerative changes, no evidence of spinal cord or nerve root compression  If there is no significant worsening of her intermittent left-sided paresthesia, we will continue observe her symptoms, please adviseher to follow-up with primary care physician, return to clinic for worse symptoms or new issues    IMPRESSION: This MRI of the cervical spine without contrast shows the following: The spinal cord appears normal. Degenerative disc changes and spondylosis as detailed above.  At C5-C6 and C6-C7, there is mild spinal stenosis due to the degenerative changes.  There is no nerve root compression. Normal enhancement pattern.

## 2022-06-05 ENCOUNTER — Ambulatory Visit: Payer: No Typology Code available for payment source | Admitting: Neurology

## 2022-06-05 NOTE — Telephone Encounter (Signed)
See my chart message from 06/05/2022.  

## 2022-06-08 ENCOUNTER — Other Ambulatory Visit: Payer: No Typology Code available for payment source

## 2022-12-06 ENCOUNTER — Other Ambulatory Visit: Payer: Self-pay | Admitting: Diagnostic Radiology

## 2022-12-06 DIAGNOSIS — R928 Other abnormal and inconclusive findings on diagnostic imaging of breast: Secondary | ICD-10-CM

## 2022-12-14 ENCOUNTER — Other Ambulatory Visit: Payer: Self-pay | Admitting: Obstetrics and Gynecology

## 2022-12-14 ENCOUNTER — Ambulatory Visit: Admission: RE | Admit: 2022-12-14 | Payer: No Typology Code available for payment source | Source: Ambulatory Visit

## 2022-12-14 ENCOUNTER — Ambulatory Visit
Admission: RE | Admit: 2022-12-14 | Discharge: 2022-12-14 | Disposition: A | Discharge status: Home / Self Care | Payer: No Typology Code available for payment source | Source: Ambulatory Visit | Attending: Diagnostic Radiology | Admitting: Diagnostic Radiology

## 2022-12-14 DIAGNOSIS — R928 Other abnormal and inconclusive findings on diagnostic imaging of breast: Secondary | ICD-10-CM

## 2022-12-25 ENCOUNTER — Other Ambulatory Visit: Payer: Self-pay | Admitting: Obstetrics and Gynecology

## 2022-12-25 ENCOUNTER — Ambulatory Visit
Admission: RE | Admit: 2022-12-25 | Discharge: 2022-12-25 | Disposition: A | Discharge status: Home / Self Care | Payer: No Typology Code available for payment source | Source: Ambulatory Visit | Attending: Obstetrics and Gynecology | Admitting: Obstetrics and Gynecology

## 2022-12-25 DIAGNOSIS — N6320 Unspecified lump in the left breast, unspecified quadrant: Secondary | ICD-10-CM

## 2022-12-25 DIAGNOSIS — R928 Other abnormal and inconclusive findings on diagnostic imaging of breast: Secondary | ICD-10-CM

## 2023-01-01 ENCOUNTER — Ambulatory Visit
Admission: RE | Admit: 2023-01-01 | Discharge: 2023-01-01 | Disposition: A | Discharge status: Home / Self Care | Payer: No Typology Code available for payment source | Source: Ambulatory Visit | Attending: Obstetrics and Gynecology | Admitting: Obstetrics and Gynecology

## 2023-01-01 ENCOUNTER — Other Ambulatory Visit: Payer: Self-pay | Admitting: Obstetrics and Gynecology

## 2023-01-01 DIAGNOSIS — N6320 Unspecified lump in the left breast, unspecified quadrant: Secondary | ICD-10-CM

## 2023-01-01 HISTORY — PX: BREAST BIOPSY: SHX20

## 2023-06-25 ENCOUNTER — Other Ambulatory Visit: Payer: Self-pay | Admitting: Obstetrics and Gynecology

## 2023-06-25 DIAGNOSIS — N6012 Diffuse cystic mastopathy of left breast: Secondary | ICD-10-CM

## 2023-07-19 ENCOUNTER — Other Ambulatory Visit: Payer: No Typology Code available for payment source

## 2023-07-23 ENCOUNTER — Ambulatory Visit
Admission: RE | Admit: 2023-07-23 | Discharge: 2023-07-23 | Disposition: A | Discharge status: Home / Self Care | Payer: No Typology Code available for payment source | Source: Ambulatory Visit | Attending: Obstetrics and Gynecology | Admitting: Obstetrics and Gynecology

## 2023-07-23 DIAGNOSIS — N6012 Diffuse cystic mastopathy of left breast: Secondary | ICD-10-CM

## 2023-10-17 ENCOUNTER — Other Ambulatory Visit: Payer: Self-pay | Admitting: Obstetrics and Gynecology

## 2023-10-17 DIAGNOSIS — Z1231 Encounter for screening mammogram for malignant neoplasm of breast: Secondary | ICD-10-CM

## 2023-12-05 ENCOUNTER — Ambulatory Visit: Payer: No Typology Code available for payment source

## 2023-12-09 LAB — EXTERNAL GENERIC LAB PROCEDURE: COLOGUARD: NEGATIVE

## 2023-12-23 ENCOUNTER — Ambulatory Visit
Admission: RE | Admit: 2023-12-23 | Discharge: 2023-12-23 | Disposition: A | Discharge status: Home / Self Care | Payer: No Typology Code available for payment source | Source: Ambulatory Visit | Attending: Obstetrics and Gynecology | Admitting: Obstetrics and Gynecology

## 2023-12-23 DIAGNOSIS — Z1231 Encounter for screening mammogram for malignant neoplasm of breast: Secondary | ICD-10-CM

## 2023-12-27 ENCOUNTER — Other Ambulatory Visit: Payer: Self-pay | Admitting: Obstetrics and Gynecology

## 2023-12-27 DIAGNOSIS — R928 Other abnormal and inconclusive findings on diagnostic imaging of breast: Secondary | ICD-10-CM

## 2024-01-09 ENCOUNTER — Other Ambulatory Visit (HOSPITAL_BASED_OUTPATIENT_CLINIC_OR_DEPARTMENT_OTHER): Payer: Self-pay

## 2024-01-09 ENCOUNTER — Ambulatory Visit

## 2024-01-09 ENCOUNTER — Ambulatory Visit
Admission: RE | Admit: 2024-01-09 | Discharge: 2024-01-09 | Disposition: A | Discharge status: Home / Self Care | Source: Ambulatory Visit | Attending: Obstetrics and Gynecology | Admitting: Obstetrics and Gynecology

## 2024-01-09 DIAGNOSIS — R928 Other abnormal and inconclusive findings on diagnostic imaging of breast: Secondary | ICD-10-CM

## 2024-01-09 MED ORDER — SHINGRIX 50 MCG/0.5ML IM SUSR
0.5000 mL | Freq: Once | INTRAMUSCULAR | 0 refills | Status: AC
  Filled 2024-01-09: qty 0.5, 1d supply, fill #0
